# Patient Record
Sex: Male | Born: 1980 | Race: Black or African American | Hispanic: No | Marital: Married | Smoking: Former smoker
Health system: Southern US, Community
[De-identification: ages and names within clinical notes are randomized; demographics above are authoritative.]

## PROBLEM LIST (undated history)

## (undated) DIAGNOSIS — F32A Depression, unspecified: Secondary | ICD-10-CM

## (undated) DIAGNOSIS — F329 Major depressive disorder, single episode, unspecified: Secondary | ICD-10-CM

## (undated) DIAGNOSIS — F419 Anxiety disorder, unspecified: Secondary | ICD-10-CM

## (undated) DIAGNOSIS — J45909 Unspecified asthma, uncomplicated: Secondary | ICD-10-CM

---

## 2008-08-30 ENCOUNTER — Emergency Department (HOSPITAL_COMMUNITY): Admission: EM | Admit: 2008-08-30 | Discharge: 2008-08-30 | Payer: Self-pay | Admitting: Emergency Medicine

## 2011-12-02 DIAGNOSIS — G459 Transient cerebral ischemic attack, unspecified: Secondary | ICD-10-CM

## 2011-12-15 DIAGNOSIS — R209 Unspecified disturbances of skin sensation: Secondary | ICD-10-CM

## 2012-11-13 ENCOUNTER — Encounter (HOSPITAL_COMMUNITY): Payer: Self-pay | Admitting: Emergency Medicine

## 2012-11-13 ENCOUNTER — Emergency Department (HOSPITAL_COMMUNITY)
Admission: EM | Admit: 2012-11-13 | Discharge: 2012-11-13 | Disposition: A | Discharge status: Home / Self Care | Payer: BC Managed Care – PPO | Attending: Emergency Medicine | Admitting: Emergency Medicine

## 2012-11-13 DIAGNOSIS — R5381 Other malaise: Secondary | ICD-10-CM | POA: Insufficient documentation

## 2012-11-13 DIAGNOSIS — F329 Major depressive disorder, single episode, unspecified: Secondary | ICD-10-CM | POA: Insufficient documentation

## 2012-11-13 DIAGNOSIS — J45909 Unspecified asthma, uncomplicated: Secondary | ICD-10-CM | POA: Insufficient documentation

## 2012-11-13 DIAGNOSIS — R109 Unspecified abdominal pain: Secondary | ICD-10-CM | POA: Insufficient documentation

## 2012-11-13 DIAGNOSIS — F3289 Other specified depressive episodes: Secondary | ICD-10-CM | POA: Insufficient documentation

## 2012-11-13 DIAGNOSIS — R5383 Other fatigue: Secondary | ICD-10-CM | POA: Insufficient documentation

## 2012-11-13 DIAGNOSIS — R197 Diarrhea, unspecified: Secondary | ICD-10-CM | POA: Insufficient documentation

## 2012-11-13 DIAGNOSIS — F411 Generalized anxiety disorder: Secondary | ICD-10-CM | POA: Insufficient documentation

## 2012-11-13 DIAGNOSIS — Z87891 Personal history of nicotine dependence: Secondary | ICD-10-CM | POA: Insufficient documentation

## 2012-11-13 DIAGNOSIS — Z79899 Other long term (current) drug therapy: Secondary | ICD-10-CM | POA: Insufficient documentation

## 2012-11-13 HISTORY — DX: Major depressive disorder, single episode, unspecified: F32.9

## 2012-11-13 HISTORY — DX: Depression, unspecified: F32.A

## 2012-11-13 HISTORY — DX: Unspecified asthma, uncomplicated: J45.909

## 2012-11-13 HISTORY — DX: Anxiety disorder, unspecified: F41.9

## 2012-11-13 MED ORDER — ONDANSETRON HCL 8 MG PO TABS: 8.0000 mg | ORAL_TABLET | Freq: Three times a day (TID) | ORAL | Status: AC | PRN

## 2012-11-13 MED ORDER — ACETAMINOPHEN 325 MG PO TABS
650.0000 mg | ORAL_TABLET | Freq: Once | ORAL | Status: AC
  Administered 2012-11-13: 650 mg via ORAL
  Filled 2012-11-13: qty 2

## 2012-11-13 MED ORDER — ONDANSETRON 8 MG PO TBDP
8.0000 mg | ORAL_TABLET | Freq: Once | ORAL | Status: AC
  Administered 2012-11-13: 8 mg via ORAL
  Filled 2012-11-13: qty 1

## 2012-11-13 MED ORDER — OXYCODONE-ACETAMINOPHEN 5-325 MG PO TABS: 1.0000 | ORAL_TABLET | ORAL | Status: DC | PRN

## 2012-11-13 NOTE — ED Notes (Signed)
Discharge instructions given and reviewed with patient.  Prescriptions given for Zofran and Percocet; effects and use explained for each.  Patient verbalized understanding of sedating effects of Percocet and to follow up as needed.  Patient ambulatory; discharged home in good condition.

## 2012-11-13 NOTE — ED Provider Notes (Signed)
History    CSN: [id number] Arrival date & time 11/13/12  0414  First MD Initiated Contact with Patient 11/13/12 [phone]     Chief Complaint  Patient presents with  . Nausea    Patient is a 32 y.o. male presenting with vomiting. The history is provided by the patient.  Emesis Severity:  Mild Duration:  1 day Timing:  Intermittent Progression:  Unchanged Chronicity:  New Relieved by:  Nothing Worsened by:  Liquids Associated symptoms: abdominal pain and diarrhea   Associated symptoms: no cough and no fever    Past Medical History  Diagnosis Date  . Asthma   . Anxiety   . Depression    History reviewed. No pertinent past surgical history. No family history on file. History  Substance Use Topics  . Smoking status: Former Games developer  . Smokeless tobacco: Not on file  . Alcohol Use: Yes    Review of Systems  Constitutional: Positive for fatigue. Negative for fever.  Respiratory: Negative for cough.   Gastrointestinal: Positive for vomiting, abdominal pain and diarrhea.  Neurological: Negative for weakness.  All other systems reviewed and are negative.    Allergies  Review of patient's allergies indicates no known allergies.  Home Medications   Current Outpatient Rx  Name  Route  Sig  Dispense  Refill  . citalopram (CELEXA) 20 MG tablet   Oral   Take 20 mg by mouth daily.         . cyclobenzaprine (FLEXERIL) 10 MG tablet   Oral   Take 10 mg by mouth 3 (three) times daily as needed for muscle spasms.          BP 143/69  Pulse 51  Temp(Src) 98.1 F (36.7 C) (Oral)  Resp 18  Ht 5\' 10"  (1.778 m)  Wt 201 lb (91.173 kg)  BMI 28.84 kg/m2  SpO2 98% Physical Exam CONSTITUTIONAL: Well developed/well nourished HEAD: Normocephalic/atraumatic EYES: EOMI/PERRL, no icterus ENMT: Mucous membranes dry NECK: supple no meningeal signs SPINE:entire spine nontender CV: S1/S2 noted, no murmurs/rubs/gallops noted LUNGS: Lungs are clear to auscultation bilaterally, no  apparent distress ABDOMEN: soft, mild tenderness in bilateral lower quadrants, no rebound or guarding, +BS noted GU:no cva tenderness. No inguinal hernia.  No testicular tenderness NEURO: Pt is awake/alert, moves all extremitiesx4 EXTREMITIES: pulses normal, full ROM SKIN: warm, color normal PSYCH: no abnormalities of mood noted  ED Course  Procedures   4:44 AM Pt reports last week he had symptoms of GI illness that resolved Yesterday he had onset of vomiting and diarrhea (nonbloody) He also reports lower abdominal pain He works as a Copy.  He has been on antibiotics/doxycycline for 3 days for "urinary infection" No recent travel/camping Will try PO challenge with zofran and oral fluids 6:20 AM Pt sleeping in the ED He feels improved No vomiting here Pt reports his lower abdominal pain started yesterday, has been intermittent and feels like spasms He has mild lower quadrant tenderness but no rebound or guarding. He does not have surgical abdomen.  I did offer further testing including labs/imaging as I can not completely rule out acute appendicitis/diverticulitis.  Pt reports he feels improved and would like try to rest at home and he wants to defer further testing.  He is aware that his condition could worsen and he should return if any worsening.  Specifically he was told to return if he continues to have pain over the next 6 hours.   MDM  Nursing notes including past medical  history and social history reviewed and considered in documentation   Joya Gaskins, MD 11/13/12 [phone]

## 2012-11-13 NOTE — ED Notes (Signed)
Patient states he had the stomach bug last week and feels like it's coming back.  States feels nauseated and has vomited x 1.

## 2016-12-17 ENCOUNTER — Encounter (HOSPITAL_COMMUNITY): Payer: Self-pay

## 2016-12-17 ENCOUNTER — Emergency Department (HOSPITAL_COMMUNITY)
Admission: EM | Admit: 2016-12-17 | Discharge: 2016-12-18 | Disposition: A | Discharge status: Home / Self Care | Payer: BC Managed Care – PPO | Attending: Emergency Medicine | Admitting: Emergency Medicine

## 2016-12-17 DIAGNOSIS — J069 Acute upper respiratory infection, unspecified: Secondary | ICD-10-CM | POA: Insufficient documentation

## 2016-12-17 DIAGNOSIS — B9789 Other viral agents as the cause of diseases classified elsewhere: Secondary | ICD-10-CM | POA: Diagnosis not present

## 2016-12-17 DIAGNOSIS — J45909 Unspecified asthma, uncomplicated: Secondary | ICD-10-CM | POA: Diagnosis not present

## 2016-12-17 DIAGNOSIS — Z79899 Other long term (current) drug therapy: Secondary | ICD-10-CM | POA: Diagnosis not present

## 2016-12-17 DIAGNOSIS — Z87891 Personal history of nicotine dependence: Secondary | ICD-10-CM | POA: Insufficient documentation

## 2016-12-17 DIAGNOSIS — R05 Cough: Secondary | ICD-10-CM | POA: Diagnosis present

## 2016-12-17 MED ORDER — IPRATROPIUM-ALBUTEROL 0.5-2.5 (3) MG/3ML IN SOLN
3.0000 mL | Freq: Once | RESPIRATORY_TRACT | Status: AC
  Administered 2016-12-18: 3 mL via RESPIRATORY_TRACT
  Filled 2016-12-17: qty 3

## 2016-12-17 MED ORDER — IBUPROFEN 400 MG PO TABS
600.0000 mg | ORAL_TABLET | Freq: Once | ORAL | Status: AC
  Administered 2016-12-17: 600 mg via ORAL
  Filled 2016-12-17: qty 2

## 2016-12-17 NOTE — ED Provider Notes (Signed)
[facility]-EMERGENCY DEPT Provider Note   CSN: [id number] Arrival date & time: 12/17/16  2256     History   Chief Complaint Chief Complaint  Patient presents with  . Cough    congestion    HPI Travis Gibson is a 36 y.o. male.  The history is provided by the patient.  Cough  This is a new problem. The current episode started more than 2 days ago. The problem occurs every few minutes. The problem has been gradually worsening. The cough is productive of sputum. There has been no fever. Associated symptoms include headaches, sore throat and shortness of breath. Pertinent negatives include no chest pain. He is not a smoker. His past medical history is significant for asthma.  pt reports recent onset (4 days ago) of cough/congestion, facial pain/HA He reports shortness of breath No active CP, though he reports recently diagnosed with pleurisy by ER physician and PCP earlier this month   Past Medical History:  Diagnosis Date  . Anxiety   . Asthma   . Depression     There are no active problems to display for this patient.   History reviewed. No pertinent surgical history.     Home Medications    Prior to Admission medications   Medication Sig Start Date End Date Taking? Authorizing Provider  albuterol (PROVENTIL HFA;VENTOLIN HFA) 108 (90 Base) MCG/ACT inhaler Inhale into the lungs every 6 (six) hours as needed for wheezing or shortness of breath.   Yes [provider]  citalopram (CELEXA) 20 MG tablet Take 20 mg by mouth daily.   Yes [provider]  emtricitabine-tenofovir (TRUVADA) 200-300 MG tablet Take 1 tablet by mouth daily.   Yes [provider]  loratadine (CLARITIN) 10 MG tablet Take 10 mg by mouth daily.   Yes [provider]  ondansetron (ZOFRAN) 8 MG tablet Take 1 tablet (8 mg total) by mouth every 8 (eight) hours as needed for nausea. 11/13/12   Zadie Rhine, MD  oxyCODONE-acetaminophen (PERCOCET/ROXICET) 5-325 MG per tablet Take  1 tablet by mouth every 4 (four) hours as needed for pain. 11/13/12   Zadie Rhine, MD    Family History No family history on file.  Social History Social History  Substance Use Topics  . Smoking status: Former Games developer  . Smokeless tobacco: Never Used  . Alcohol use Yes     Allergies   Patient has no known allergies.   Review of Systems Review of Systems  Constitutional: Positive for fatigue.  HENT: Positive for sore throat.   Respiratory: Positive for cough and shortness of breath.   Cardiovascular: Negative for chest pain.  Neurological: Positive for headaches.  All other systems reviewed and are negative.    Physical Exam Updated Vital Signs BP (!) 142/77 (BP Location: Left Arm)   Pulse 60   Temp 98.8 F (37.1 C) (Oral)   Resp 18   Ht 1.778 m (5\' 10" )   Wt 97.5 kg (215 lb)   SpO2 98%   BMI 30.85 kg/m   Physical Exam CONSTITUTIONAL: Well developed/well nourished HEAD: Normocephalic/atraumatic EYES: EOMI/PERRL ENMT: Mucous membranes moist, nasal congestion NECK: supple no meningeal signs SPINE/BACK:entire spine nontender CV: S1/S2 noted, no murmurs/rubs/gallops noted LUNGS: decreased BS noted bilaterally, no distress noted ABDOMEN: soft, nontender, no rebound or guarding, bowel sounds noted throughout abdomen GU:no cva tenderness NEURO: Pt is awake/alert/appropriate, moves all extremitiesx4.  No facial droop.   EXTREMITIES: pulses normal/equal, full ROM SKIN: warm, color normal PSYCH: no abnormalities of mood noted,  alert and oriented to situation   ED Treatments / Results  Labs (all labs ordered are listed, but only abnormal results are displayed) Labs Reviewed - No data to display  EKG  EKG Interpretation None       Radiology No results found.  Procedures Procedures (including critical care time)  Medications Ordered in ED Medications  ipratropium-albuterol (DUONEB) 0.5-2.5 (3) MG/3ML nebulizer solution 3 mL (3 mLs Nebulization  Given 12/18/16 0001)  ibuprofen (ADVIL,MOTRIN) tablet 600 mg (600 mg Oral Given 12/17/16 2348)  albuterol (PROVENTIL HFA;VENTOLIN HFA) 108 (90 Base) MCG/ACT inhaler (  Given 12/18/16 0007)  dexamethasone (DECADRON) tablet 8 mg (8 mg Oral Given 12/18/16 0037)     Initial Impression / Assessment and Plan / ED Course  I have reviewed the triage vital signs and the nursing notes.   Pt improved  no distress Lung sounds improved, no crackles/wheeze Will d/c home Probable viral illness Will add on albuterol and one time dose of decadron due to h/o asthma Defer antibiotics Pt agreeable He is on truvada for PREP, he does not have HIV, he has never tested for positive   Final Clinical Impressions(s) / ED Diagnoses   Final diagnoses:  Viral URI with cough    New Prescriptions New Prescriptions   No medications on file     Zadie Rhine, MD 12/18/16 0040

## 2016-12-17 NOTE — ED Triage Notes (Signed)
Productive cough, sinus pressure and headache for several days.

## 2016-12-18 MED ORDER — DEXAMETHASONE 4 MG PO TABS
8.0000 mg | ORAL_TABLET | Freq: Once | ORAL | Status: AC
  Administered 2016-12-18: 8 mg via ORAL
  Filled 2016-12-18: qty 2

## 2016-12-18 MED ORDER — ALBUTEROL SULFATE HFA 108 (90 BASE) MCG/ACT IN AERS
INHALATION_SPRAY | RESPIRATORY_TRACT | Status: AC
  Administered 2016-12-18
  Filled 2016-12-18: qty 6.7

## 2019-08-07 ENCOUNTER — Emergency Department (HOSPITAL_COMMUNITY): Payer: BC Managed Care – PPO

## 2019-08-07 ENCOUNTER — Other Ambulatory Visit: Payer: Self-pay

## 2019-08-07 ENCOUNTER — Encounter (HOSPITAL_COMMUNITY): Payer: Self-pay | Admitting: Emergency Medicine

## 2019-08-07 ENCOUNTER — Emergency Department (HOSPITAL_COMMUNITY)
Admission: EM | Admit: 2019-08-07 | Discharge: 2019-08-07 | Disposition: A | Discharge status: Home / Self Care | Payer: BC Managed Care – PPO | Attending: Emergency Medicine | Admitting: Emergency Medicine

## 2019-08-07 DIAGNOSIS — N23 Unspecified renal colic: Secondary | ICD-10-CM | POA: Diagnosis not present

## 2019-08-07 DIAGNOSIS — Z79899 Other long term (current) drug therapy: Secondary | ICD-10-CM | POA: Diagnosis not present

## 2019-08-07 DIAGNOSIS — R1032 Left lower quadrant pain: Secondary | ICD-10-CM | POA: Diagnosis present

## 2019-08-07 DIAGNOSIS — Z87891 Personal history of nicotine dependence: Secondary | ICD-10-CM | POA: Insufficient documentation

## 2019-08-07 DIAGNOSIS — J45909 Unspecified asthma, uncomplicated: Secondary | ICD-10-CM | POA: Diagnosis not present

## 2019-08-07 LAB — CBC WITH DIFFERENTIAL/PLATELET
Abs Immature Granulocytes: 0.01 10*3/uL (ref 0.00–0.07)
Basophils Absolute: 0 10*3/uL (ref 0.0–0.1)
Basophils Relative: 1 %
Eosinophils Absolute: 0.2 10*3/uL (ref 0.0–0.5)
Eosinophils Relative: 4 %
HCT: 40.9 % (ref 39.0–52.0)
Hemoglobin: 13.6 g/dL (ref 13.0–17.0)
Immature Granulocytes: 0 %
Lymphocytes Relative: 36 %
Lymphs Abs: 1.9 10*3/uL (ref 0.7–4.0)
MCH: 31.6 pg (ref 26.0–34.0)
MCHC: 33.3 g/dL (ref 30.0–36.0)
MCV: 95.1 fL (ref 80.0–100.0)
Monocytes Absolute: 0.5 10*3/uL (ref 0.1–1.0)
Monocytes Relative: 9 %
Neutro Abs: 2.7 10*3/uL (ref 1.7–7.7)
Neutrophils Relative %: 50 %
Platelets: 324 10*3/uL (ref 150–400)
RBC: 4.3 MIL/uL (ref 4.22–5.81)
RDW: 13.4 % (ref 11.5–15.5)
WBC: 5.3 10*3/uL (ref 4.0–10.5)
nRBC: 0 % (ref 0.0–0.2)

## 2019-08-07 LAB — URINALYSIS, ROUTINE W REFLEX MICROSCOPIC
Bacteria, UA: NONE SEEN
Bilirubin Urine: NEGATIVE
Glucose, UA: NEGATIVE mg/dL
Ketones, ur: NEGATIVE mg/dL
Leukocytes,Ua: NEGATIVE
Nitrite: NEGATIVE
Protein, ur: NEGATIVE mg/dL
RBC / HPF: >50 RBC/hpf — ABNORMAL HIGH (ref 0–5)
Specific Gravity, Urine: 1.025 (ref 1.005–1.030)
pH: 6 (ref 5.0–8.0)

## 2019-08-07 LAB — BASIC METABOLIC PANEL
Anion gap: 5 (ref 5–15)
BUN: 14 mg/dL (ref 6–20)
CO2: 27 mmol/L (ref 22–32)
Calcium: 8.8 mg/dL — ABNORMAL LOW (ref 8.9–10.3)
Chloride: 108 mmol/L (ref 98–111)
Creatinine, Ser: 1.05 mg/dL (ref 0.61–1.24)
GFR calc Af Amer: >60 mL/min (ref >60)
GFR calc non Af Amer: >60 mL/min (ref >60)
Glucose, Bld: 104 mg/dL — ABNORMAL HIGH (ref 70–99)
Potassium: 3.7 mmol/L (ref 3.5–5.1)
Sodium: 140 mmol/L (ref 135–145)

## 2019-08-07 MED ORDER — KETOROLAC TROMETHAMINE 30 MG/ML IJ SOLN
30.0000 mg | Freq: Once | INTRAMUSCULAR | Status: AC
  Administered 2019-08-07: 30 mg via INTRAMUSCULAR
  Filled 2019-08-07: qty 1

## 2019-08-07 MED ORDER — TAMSULOSIN HCL 0.4 MG PO CAPS: 0.4000 mg | ORAL_CAPSULE | Freq: Every day | ORAL | 0 refills | Status: AC

## 2019-08-07 MED ORDER — NAPROXEN 500 MG PO TABS: 500.0000 mg | ORAL_TABLET | Freq: Two times a day (BID) | ORAL | 0 refills | Status: AC

## 2019-08-07 NOTE — ED Notes (Signed)
Patient signed for discharge papers but computer did not capture patient's signature even though signature is on the signature pad.

## 2019-08-07 NOTE — ED Provider Notes (Signed)
[facility] EMERGENCY DEPARTMENT Provider Note   CSN: [id number] Arrival date & time: 08/07/19  0053     History No chief complaint on file.   Travis Gibson is a 39 y.o. male.  HPI     This is a 39 year old male who presents with left flank and left lower quadrant pain.  Patient reports 2 to 3-day history of dull and sometimes sharp left lower quadrant pain.  At times it goes into his left testicle.  He also reports some dysuria and strong smell to his urine.  He thinks he may have a urinary tract infection.  He does have a history of kidney stones but states in the past his kidney stones were "much more severe and I vomited a lot."  Currently he states he is comfortable and his pain is 0.  However, at times if he moves a certain way, it increases significantly.  He has not noted any fevers.  He has taken Tylenol at home for his pain.  Past Medical History:  Diagnosis Date  . Anxiety   . Asthma   . Depression     There are no problems to display for this patient.   History reviewed. No pertinent surgical history.     No family history on file.  Social History   Tobacco Use  . Smoking status: Former Games developer  . Smokeless tobacco: Never Used  Substance Use Topics  . Alcohol use: Yes  . Drug use: No    Home Medications Prior to Admission medications   Medication Sig Start Date End Date Taking? Authorizing Provider  albuterol (PROVENTIL HFA;VENTOLIN HFA) 108 (90 Base) MCG/ACT inhaler Inhale into the lungs every 6 (six) hours as needed for wheezing or shortness of breath.    [provider]  citalopram (CELEXA) 20 MG tablet Take 20 mg by mouth daily.    [provider]  emtricitabine-tenofovir (TRUVADA) 200-300 MG tablet Take 1 tablet by mouth daily.    [provider]  loratadine (CLARITIN) 10 MG tablet Take 10 mg by mouth daily.    [provider]  naproxen (NAPROSYN) 500 MG tablet Take 1 tablet (500 mg total) by mouth 2 (two) times  daily. 08/07/19   Lee Kuang, Mayer Masker, MD  ondansetron (ZOFRAN) 8 MG tablet Take 1 tablet (8 mg total) by mouth every 8 (eight) hours as needed for nausea. 11/13/12   Zadie Rhine, MD  oxyCODONE-acetaminophen (PERCOCET/ROXICET) 5-325 MG per tablet Take 1 tablet by mouth every 4 (four) hours as needed for pain. 11/13/12   Zadie Rhine, MD  tamsulosin (FLOMAX) 0.4 MG CAPS capsule Take 1 capsule (0.4 mg total) by mouth daily. 08/07/19   Caedence Snowden, Mayer Masker, MD    Allergies    Patient has no known allergies.  Review of Systems   Review of Systems  Constitutional: Negative for fever.  Respiratory: Negative for shortness of breath.   Cardiovascular: Negative for chest pain.  Gastrointestinal: Negative for abdominal pain, diarrhea, nausea and vomiting.  Genitourinary: Positive for dysuria and flank pain. Negative for hematuria.  All other systems reviewed and are negative.   Physical Exam Updated Vital Signs BP (!) 159/84   Pulse 65   Temp 97.8 F (36.6 C) (Oral)   Resp 17   Ht 1.778 m (5\' 10" )   Wt 101.2 kg   SpO2 96%   BMI 32.00 kg/m   Physical Exam Vitals and nursing note reviewed.  Constitutional:      Appearance: He is well-developed. He is  not ill-appearing.  HENT:     Head: Normocephalic and atraumatic.     Mouth/Throat:     Mouth: Mucous membranes are moist.  Eyes:     Pupils: Pupils are equal, round, and reactive to light.  Cardiovascular:     Rate and Rhythm: Normal rate and regular rhythm.     Heart sounds: Normal heart sounds. No murmur.  Pulmonary:     Effort: Pulmonary effort is normal. No respiratory distress.     Breath sounds: Normal breath sounds. No wheezing.  Abdominal:     General: Bowel sounds are normal.     Palpations: Abdomen is soft.     Tenderness: There is no abdominal tenderness. There is no right CVA tenderness, left CVA tenderness or rebound.  Musculoskeletal:     Cervical back: Neck supple.     Right lower leg: No edema.     Left lower  leg: No edema.  Lymphadenopathy:     Cervical: No cervical adenopathy.  Skin:    General: Skin is warm and dry.  Neurological:     Mental Status: He is alert and oriented to person, place, and time.  Psychiatric:        Mood and Affect: Mood normal.     ED Results / Procedures / Treatments   Labs (all labs ordered are listed, but only abnormal results are displayed) Labs Reviewed  BASIC METABOLIC PANEL - Abnormal; Notable for the following components:      Result Value   Glucose, Bld 104 (*)    Calcium 8.8 (*)    All other components within normal limits  URINALYSIS, ROUTINE W REFLEX MICROSCOPIC - Abnormal; Notable for the following components:   APPearance HAZY (*)    Hgb urine dipstick MODERATE (*)    RBC / HPF >50 (*)    All other components within normal limits  CBC WITH DIFFERENTIAL/PLATELET    EKG None  Radiology DG Abdomen 1 View  Result Date: 08/07/2019 CLINICAL DATA:  Left-sided flank pain EXAM: ABDOMEN - 1 VIEW COMPARISON:  None. FINDINGS: Nonobstructed bowel-gas pattern. Possible 6 mm faint stone over the left kidney. No paraspinal or pelvic calcification. IMPRESSION: 1. Nonobstructed gas pattern. 2. Possible left kidney stone Electronically Signed   By: Jasmine Pang M.D.   On: 08/07/2019 02:37    Procedures Procedures (including critical care time)  Medications Ordered in ED Medications  ketorolac (TORADOL) 30 MG/ML injection 30 mg (30 mg Intramuscular Given 08/07/19 2563)    ED Course  I have reviewed the triage vital signs and the nursing notes.  Pertinent labs & imaging results that were available during my care of the patient were reviewed by me and considered in my medical decision making (see chart for details).    MDM Rules/Calculators/A&P                       Patient presents with left lower quadrant and flank pain.  He is overall nontoxic and vital signs are reassuring.  He is afebrile.  He is nontender on exam.  History suggestive of  possible UTI versus kidney stone.  Musculoskeletal etiology is also consideration.  Given that he is well-appearing without significant pain, will obtain a KUB to evaluate for possible radiopaque stone.  Lab work obtained.  No leukocytosis.  No significant metabolic derangement or kidney dysfunction.  Urinalysis with greater than 50 red cells.  KUB shows likely 6 mm left ureteral stone.  On recheck, patient remains  comfortable.  He was given Toradol.  We will plan for supportive measures at home including aggressive hydration, Flomax, and naproxen for pain given the patient is fairly well controlled at this time.  After history, exam, and medical workup I feel the patient has been appropriately medically screened and is safe for discharge home. Pertinent diagnoses were discussed with the patient. Patient was given return precautions.   Final Clinical Impression(s) / ED Diagnoses Final diagnoses:  Ureteral colic    Rx / DC Orders ED Discharge Orders         Ordered    naproxen (NAPROSYN) 500 MG tablet  2 times daily     08/07/19 0319    tamsulosin (FLOMAX) 0.4 MG CAPS capsule  Daily     08/07/19 0319           Merryl Hacker, MD 08/07/19 [phone]

## 2019-08-07 NOTE — ED Triage Notes (Signed)
Pt c/o left lower abd pain that radiates to left testicle x 2 days.

## 2019-08-07 NOTE — Discharge Instructions (Addendum)
You were seen today for left flank pain and urinary symptoms.  You likely have a 6 mm kidney stone on the left side.  Make sure to stay well-hydrated.  Take naproxen as needed for pain.  Take Flomax.  Follow-up with urology as needed.  If pain worsens or you develop nausea and vomiting or inability to tolerate fluids, you should be reevaluated.

## 2020-09-25 ENCOUNTER — Other Ambulatory Visit: Payer: Self-pay

## 2020-09-25 ENCOUNTER — Encounter (HOSPITAL_COMMUNITY): Payer: Self-pay | Admitting: Emergency Medicine

## 2020-09-25 DIAGNOSIS — M25562 Pain in left knee: Secondary | ICD-10-CM | POA: Insufficient documentation

## 2020-09-25 DIAGNOSIS — M542 Cervicalgia: Secondary | ICD-10-CM | POA: Diagnosis not present

## 2020-09-25 DIAGNOSIS — R6884 Jaw pain: Secondary | ICD-10-CM | POA: Diagnosis not present

## 2020-09-25 DIAGNOSIS — F32A Depression, unspecified: Secondary | ICD-10-CM | POA: Diagnosis not present

## 2020-09-25 DIAGNOSIS — M25522 Pain in left elbow: Secondary | ICD-10-CM | POA: Diagnosis not present

## 2020-09-25 DIAGNOSIS — Z87891 Personal history of nicotine dependence: Secondary | ICD-10-CM | POA: Insufficient documentation

## 2020-09-25 DIAGNOSIS — M79641 Pain in right hand: Secondary | ICD-10-CM | POA: Diagnosis not present

## 2020-09-25 DIAGNOSIS — M25521 Pain in right elbow: Secondary | ICD-10-CM | POA: Insufficient documentation

## 2020-09-25 DIAGNOSIS — S0990XA Unspecified injury of head, initial encounter: Secondary | ICD-10-CM | POA: Insufficient documentation

## 2020-09-25 DIAGNOSIS — M25561 Pain in right knee: Secondary | ICD-10-CM | POA: Insufficient documentation

## 2020-09-25 DIAGNOSIS — Y990 Civilian activity done for income or pay: Secondary | ICD-10-CM | POA: Diagnosis not present

## 2020-09-25 DIAGNOSIS — T148XXA Other injury of unspecified body region, initial encounter: Secondary | ICD-10-CM | POA: Diagnosis not present

## 2020-09-25 DIAGNOSIS — S0003XA Contusion of scalp, initial encounter: Secondary | ICD-10-CM | POA: Insufficient documentation
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
## 2020-09-26 ENCOUNTER — Emergency Department (HOSPITAL_COMMUNITY): Payer: No Typology Code available for payment source

## 2020-09-26 ENCOUNTER — Emergency Department (HOSPITAL_COMMUNITY)
Admission: EM | Admit: 2020-09-26 | Discharge: 2020-09-26 | Disposition: A | Discharge status: Home / Self Care | Payer: No Typology Code available for payment source | Attending: Emergency Medicine | Admitting: Emergency Medicine

## 2020-09-26 DIAGNOSIS — S0990XA Unspecified injury of head, initial encounter: Secondary | ICD-10-CM

## 2020-09-26 DIAGNOSIS — T07XXXA Unspecified multiple injuries, initial encounter: Secondary | ICD-10-CM

## 2020-09-26 MED ORDER — IBUPROFEN 800 MG PO TABS
800.0000 mg | ORAL_TABLET | Freq: Once | ORAL | Status: AC
  Administered 2020-09-26: 800 mg via ORAL
  Filled 2020-09-26: qty 1

## 2020-09-26 MED ORDER — ACETAMINOPHEN 500 MG PO TABS
1000.0000 mg | ORAL_TABLET | Freq: Once | ORAL | Status: AC
Start: 2020-09-26 — End: 2020-09-26
  Administered 2020-09-26: 1000 mg via ORAL
  Filled 2020-09-26: qty 2
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
## 2022-01-07 ENCOUNTER — Other Ambulatory Visit: Payer: Self-pay

## 2022-01-07 ENCOUNTER — Encounter (HOSPITAL_COMMUNITY): Payer: Self-pay | Admitting: *Deleted

## 2022-01-07 ENCOUNTER — Emergency Department (HOSPITAL_COMMUNITY): Payer: BC Managed Care – PPO

## 2022-01-07 ENCOUNTER — Emergency Department (HOSPITAL_COMMUNITY)
Admission: EM | Admit: 2022-01-07 | Discharge: 2022-01-07 | Disposition: A | Discharge status: Home / Self Care | Payer: BC Managed Care – PPO | Attending: Emergency Medicine | Admitting: Emergency Medicine

## 2022-01-07 DIAGNOSIS — Y990 Civilian activity done for income or pay: Secondary | ICD-10-CM | POA: Diagnosis not present

## 2022-01-07 DIAGNOSIS — S8992XA Unspecified injury of left lower leg, initial encounter: Secondary | ICD-10-CM | POA: Insufficient documentation

## 2022-01-07 DIAGNOSIS — X501XXA Overexertion from prolonged static or awkward postures, initial encounter: Secondary | ICD-10-CM | POA: Insufficient documentation

## 2022-01-07 DIAGNOSIS — M25562 Pain in left knee: Secondary | ICD-10-CM | POA: Diagnosis not present

## 2022-01-07 DIAGNOSIS — M25462 Effusion, left knee: Secondary | ICD-10-CM | POA: Insufficient documentation

## 2022-01-07 MED ORDER — OXYCODONE-ACETAMINOPHEN 5-325 MG PO TABS: 1.0000 | ORAL_TABLET | ORAL | 0 refills | Status: AC | PRN

## 2022-01-07 NOTE — ED Triage Notes (Signed)
Pt reports he heard a "pop" sound and felt pain to the left inner knee while doing "break falls" today. Pt also reports the bottom of his left foot is tingling.

## 2022-01-07 NOTE — ED Provider Notes (Signed)
[facility] EMERGENCY DEPARTMENT Provider Note   CSN: [id number] Arrival date & time: 01/07/22  1526     History  Chief Complaint  Patient presents with   Knee Pain    Travis Gibson is a 41 y.o. male.  Pt complains of pain in left knee after injuring during a training exercise at work.  Pt reports tearing pain in inside area of left knee.  Pt reports pain in full knee.  Pt reports difficulty walking and bending.   The history is provided by the patient. No language interpreter was used.  Knee Pain Location:  Knee Time since incident:  4 hours Injury: yes   Knee location:  L knee Pain details:    Quality:  Throbbing   Radiates to:  Does not radiate   Severity:  Moderate   Onset quality:  Sudden   Timing:  Constant   Progression:  Worsening Relieved by:  Nothing Worsened by:  Abduction Risk factors: known bone disorder        Home Medications Prior to Admission medications   Medication Sig Start Date End Date Taking? Authorizing Provider  albuterol (PROVENTIL HFA;VENTOLIN HFA) 108 (90 Base) MCG/ACT inhaler Inhale into the lungs every 6 (six) hours as needed for wheezing or shortness of breath.    [provider]  citalopram (CELEXA) 20 MG tablet Take 20 mg by mouth daily.    [provider]  emtricitabine-tenofovir (TRUVADA) 200-300 MG tablet Take 1 tablet by mouth daily.    [provider]  loratadine (CLARITIN) 10 MG tablet Take 10 mg by mouth daily.    [provider]  naproxen (NAPROSYN) 500 MG tablet Take 1 tablet (500 mg total) by mouth 2 (two) times daily. 08/07/19   Horton, Mayer Masker, MD  ondansetron (ZOFRAN) 8 MG tablet Take 1 tablet (8 mg total) by mouth every 8 (eight) hours as needed for nausea. 11/13/12   Zadie Rhine, MD  oxyCODONE-acetaminophen (PERCOCET/ROXICET) 5-325 MG per tablet Take 1 tablet by mouth every 4 (four) hours as needed for pain. 11/13/12   Zadie Rhine, MD  tamsulosin (FLOMAX) 0.4 MG CAPS capsule  Take 1 capsule (0.4 mg total) by mouth daily. 08/07/19   Horton, Mayer Masker, MD      Allergies    Patient has no known allergies.    Review of Systems   Review of Systems  Musculoskeletal:  Positive for gait problem and joint swelling.  All other systems reviewed and are negative.   Physical Exam Updated Vital Signs BP (!) 149/84 (BP Location: Left Arm)   Pulse 79   Temp 98.3 F (36.8 C)   Resp 20   Ht 5\' 10"  (1.778 m)   Wt 108.9 kg   SpO2 96%   BMI 34.44 kg/m  Physical Exam Vitals and nursing note reviewed.  Constitutional:      General: He is not in acute distress.    Appearance: He is well-developed.  HENT:     Head: Normocephalic and atraumatic.  Eyes:     Conjunctiva/sclera: Conjunctivae normal.  Cardiovascular:     Rate and Rhythm: Normal rate.  Pulmonary:     Effort: Pulmonary effort is normal.  Musculoskeletal:        General: Swelling and tenderness present.     Comments: Effusion left knee,  pain with range of motion  nv and ns intact,  tender medial joint,    Skin:    General: Skin is warm and dry.     Capillary  Refill: Capillary refill takes less than 2 seconds.  Neurological:     Mental Status: He is alert.  Psychiatric:        Mood and Affect: Mood normal.     ED Results / Procedures / Treatments   Labs (all labs ordered are listed, but only abnormal results are displayed) Labs Reviewed - No data to display  EKG None  Radiology DG Knee Complete 4 Views Left  Result Date: 01/07/2022 CLINICAL DATA:  Pain with popping sound. EXAM: LEFT KNEE - COMPLETE 4+ VIEW COMPARISON:  September 26, 2020 FINDINGS: No evidence of fracture, or dislocation. There is however moderate in size suprapatellar joint effusion. Mild overlying soft tissue swelling. IMPRESSION: 1. Moderate in size suprapatellar joint effusion. In the settings of acute trauma this may represent radio occult fracture or internal derangement of the knee. Recommend further evaluation with CT or MRI  of the knee. Electronically Signed   By: Ted Mcalpine M.D.   On: 01/07/2022 16:13    Procedures Procedures    Medications Ordered in ED Medications - No data to display  ED Course/ Medical Decision Making/ A&P                           Medical Decision Making Pt reports doing training exercise at work and had a tearing sensation in right knee  Amount and/or Complexity of Data Reviewed Radiology: ordered and independent interpretation performed. Decision-making details documented in ED Course.    Details: Xray shows suprapatella joint effusion.    Risk Prescription drug management. Risk Details: Pt placed in a knee immobilizer and given crutches.  Rx for percocet.  Pt advised he needs to follow up with Orthopaedist.  Pt given number for Dr. Romeo Apple.   I advised pt I am concerned that he has a significant ligamentous injury.             Final Clinical Impression(s) / ED Diagnoses Final diagnoses:  Effusion of left knee  Injury of ligament of left knee, initial encounter    Rx / DC Orders ED Discharge Orders          Ordered    oxyCODONE-acetaminophen (PERCOCET) 5-325 MG tablet  Every 4 hours PRN        01/07/22 1753          An After Visit Summary was printed and given to the patient.     Osie Cheeks 01/07/22 2209    Rondel Baton, MD 01/10/22 1500

## 2022-01-26 ENCOUNTER — Telehealth: Payer: Self-pay | Admitting: Orthopedic Surgery
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
## 2022-01-28 NOTE — Telephone Encounter (Signed)
Called patient , reached voice mail, left message.

## 2022-02-02 NOTE — Telephone Encounter (Signed)
No response from patient to message left.

## 2022-06-24 ENCOUNTER — Encounter: Payer: Self-pay | Admitting: Radiology
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
